# Patient Record
Sex: Female | Born: 1950
Health system: Southern US, Community
[De-identification: ages and names within clinical notes are randomized; demographics above are authoritative.]

## PROBLEM LIST (undated history)

## (undated) DIAGNOSIS — I10 Essential (primary) hypertension: Secondary | ICD-10-CM

## (undated) DIAGNOSIS — R3129 Other microscopic hematuria: Secondary | ICD-10-CM

## (undated) HISTORY — PX: KNEE ARTHROSCOPY: SUR90

## (undated) HISTORY — DX: Other microscopic hematuria: R31.29

---

## 2002-12-12 LAB — HM COLONOSCOPY

## 2007-02-13 LAB — HM PAP SMEAR

## 2007-02-13 LAB — CONVERTED CEMR LAB

## 2007-07-26 ENCOUNTER — Ambulatory Visit: Payer: Self-pay | Admitting: Internal Medicine

## 2007-07-26 DIAGNOSIS — R3129 Other microscopic hematuria: Secondary | ICD-10-CM | POA: Insufficient documentation

## 2007-07-26 LAB — CONVERTED CEMR LAB
Bilirubin Urine: NEGATIVE
Glucose, Urine, Semiquant: NEGATIVE
Nitrite: NEGATIVE
Protein, U semiquant: NEGATIVE
Specific Gravity, Urine: 1.025
Urobilinogen, UA: 0.2
WBC Urine, dipstick: NEGATIVE
pH: 5

## 2007-07-27 ENCOUNTER — Encounter: Payer: Self-pay | Admitting: Internal Medicine

## 2007-07-28 LAB — CONVERTED CEMR LAB
BUN: 8 mg/dL (ref 6–23)
Basophils Absolute: 0 10*3/uL (ref 0.0–0.1)
Basophils Relative: 0.3 % (ref 0.0–3.0)
CO2: 28 meq/L (ref 19–32)
Calcium: 9.5 mg/dL (ref 8.4–10.5)
Chloride: 108 meq/L (ref 96–112)
Creatinine, Ser: 0.8 mg/dL (ref 0.4–1.2)
Eosinophils Absolute: 0.1 10*3/uL (ref 0.0–0.7)
Eosinophils Relative: 1.3 % (ref 0.0–5.0)
GFR calc Af Amer: 96 mL/min
GFR calc non Af Amer: 79 mL/min
Glucose, Bld: 115 mg/dL — ABNORMAL HIGH (ref 70–99)
HCT: 41.4 % (ref 36.0–46.0)
Hemoglobin: 14.4 g/dL (ref 12.0–15.0)
Lymphocytes Relative: 32.9 % (ref 12.0–46.0)
MCHC: 34.7 g/dL (ref 30.0–36.0)
MCV: 89.8 fL (ref 78.0–100.0)
Monocytes Absolute: 0.7 10*3/uL (ref 0.1–1.0)
Monocytes Relative: 10.9 % (ref 3.0–12.0)
Neutro Abs: 3.2 10*3/uL (ref 1.4–7.7)
Neutrophils Relative %: 54.6 % (ref 43.0–77.0)
Platelets: 306 10*3/uL (ref 150–400)
Potassium: 3.8 meq/L (ref 3.5–5.1)
RBC: 4.61 M/uL (ref 3.87–5.11)
RDW: 12.9 % (ref 11.5–14.6)
Sodium: 140 meq/L (ref 135–145)
WBC: 6 10*3/uL (ref 4.5–10.5)

## 2007-08-10 ENCOUNTER — Ambulatory Visit: Payer: Self-pay | Admitting: Internal Medicine

## 2007-08-18 ENCOUNTER — Ambulatory Visit: Payer: Self-pay | Admitting: Internal Medicine

## 2007-08-18 LAB — CONVERTED CEMR LAB: Crystals, Fluid: NONE SEEN

## 2007-08-19 ENCOUNTER — Encounter: Payer: Self-pay | Admitting: Internal Medicine

## 2007-12-01 ENCOUNTER — Telehealth: Payer: Self-pay | Admitting: Internal Medicine

## 2009-09-14 ENCOUNTER — Emergency Department (HOSPITAL_BASED_OUTPATIENT_CLINIC_OR_DEPARTMENT_OTHER): Admission: EM | Admit: 2009-09-14 | Discharge: 2009-09-14 | Payer: Self-pay | Admitting: Emergency Medicine

## 2009-10-21 ENCOUNTER — Ambulatory Visit: Payer: Self-pay | Admitting: Internal Medicine

## 2009-10-21 LAB — CONVERTED CEMR LAB
Bilirubin Urine: NEGATIVE
Glucose, Urine, Semiquant: NEGATIVE
Ketones, urine, test strip: NEGATIVE
Nitrite: NEGATIVE
Protein, U semiquant: NEGATIVE
Specific Gravity, Urine: 1.02
Urobilinogen, UA: 0.2
WBC Urine, dipstick: NEGATIVE
pH: 7

## 2009-10-22 ENCOUNTER — Encounter: Payer: Self-pay | Admitting: Internal Medicine

## 2009-10-28 ENCOUNTER — Ambulatory Visit: Payer: Self-pay | Admitting: Internal Medicine

## 2009-10-28 LAB — CONVERTED CEMR LAB
ALT: 31 units/L (ref 0–35)
Alkaline Phosphatase: 129 units/L — ABNORMAL HIGH (ref 39–117)
Basophils Relative: 0.4 % (ref 0.0–3.0)
Bilirubin, Direct: 0.1 mg/dL (ref 0.0–0.3)
Calcium: 9.4 mg/dL (ref 8.4–10.5)
Chloride: 103 meq/L (ref 96–112)
Cholesterol: 225 mg/dL — ABNORMAL HIGH (ref 0–200)
Creatinine, Ser: 0.7 mg/dL (ref 0.4–1.2)
Eosinophils Relative: 1.7 % (ref 0.0–5.0)
Lymphocytes Relative: 33.3 % (ref 12.0–46.0)
MCV: 90.7 fL (ref 78.0–100.0)
Neutrophils Relative %: 52.7 % (ref 43.0–77.0)
RBC: 4.49 M/uL (ref 3.87–5.11)
Sodium: 138 meq/L (ref 135–145)
Total CHOL/HDL Ratio: 4
Total Protein: 6.9 g/dL (ref 6.0–8.3)
VLDL: 22.4 mg/dL (ref 0.0–40.0)
WBC: 6.1 10*3/uL (ref 4.5–10.5)

## 2009-10-30 ENCOUNTER — Ambulatory Visit: Payer: Self-pay | Admitting: Internal Medicine

## 2009-11-06 ENCOUNTER — Ambulatory Visit: Payer: Self-pay | Admitting: Internal Medicine

## 2009-11-06 DIAGNOSIS — M25469 Effusion, unspecified knee: Secondary | ICD-10-CM

## 2009-11-12 IMAGING — CR DG KNEE COMPLETE 4+V*R*
4 series · 4 of 4 positions shown · non-contrast
Comparison: None available.

CLINICAL DATA: Knee pain.

RIGHT KNEE - COMPLETE 4+ VIEW

[view not recorded (1 of 4)]
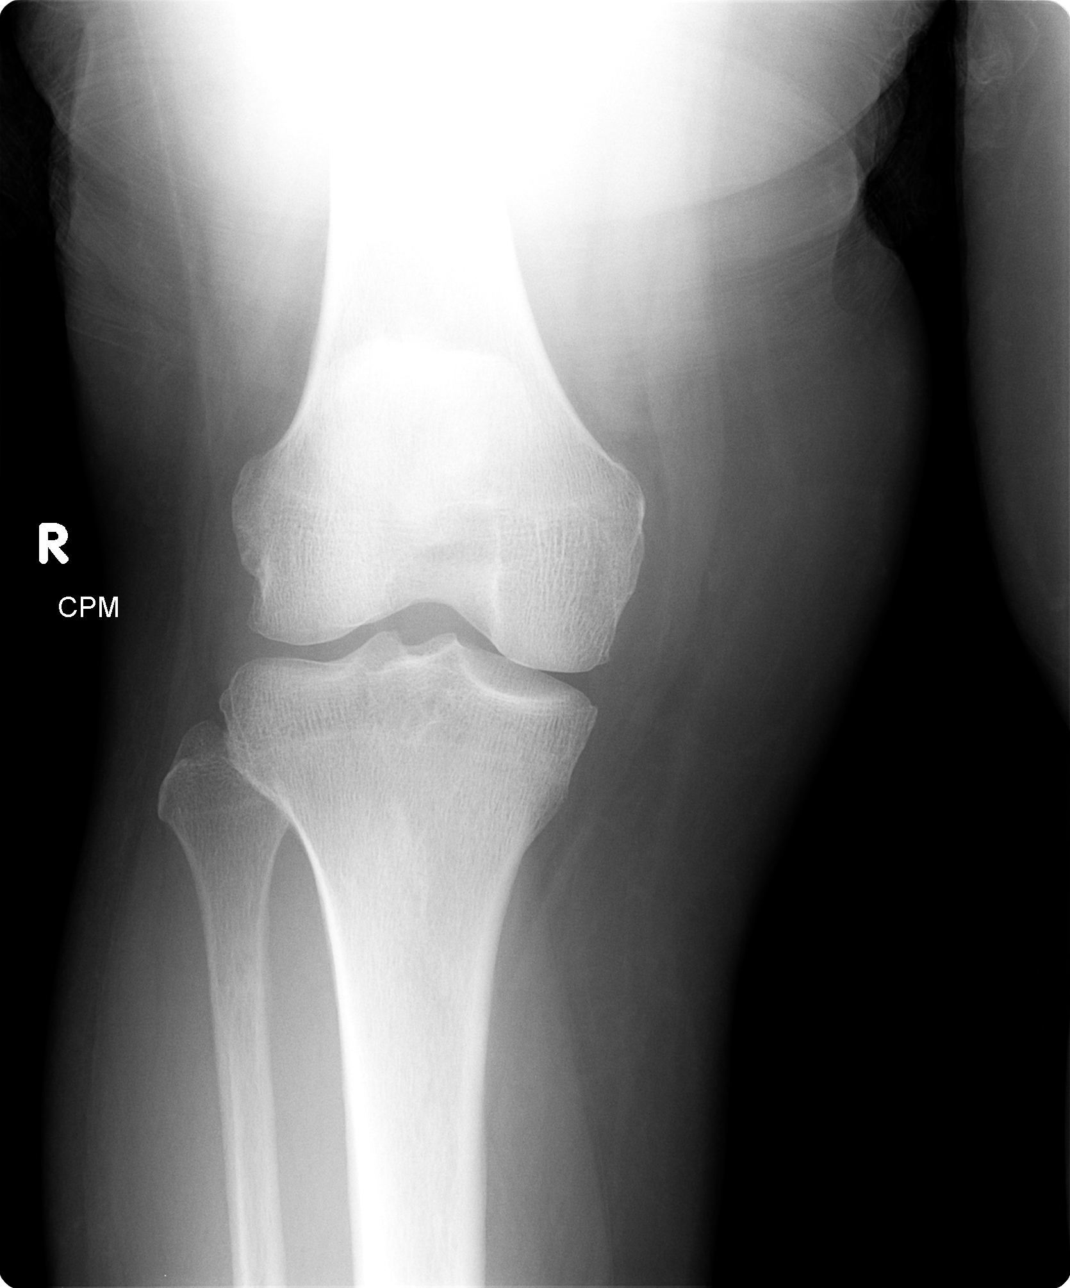

[view not recorded (2 of 4)]
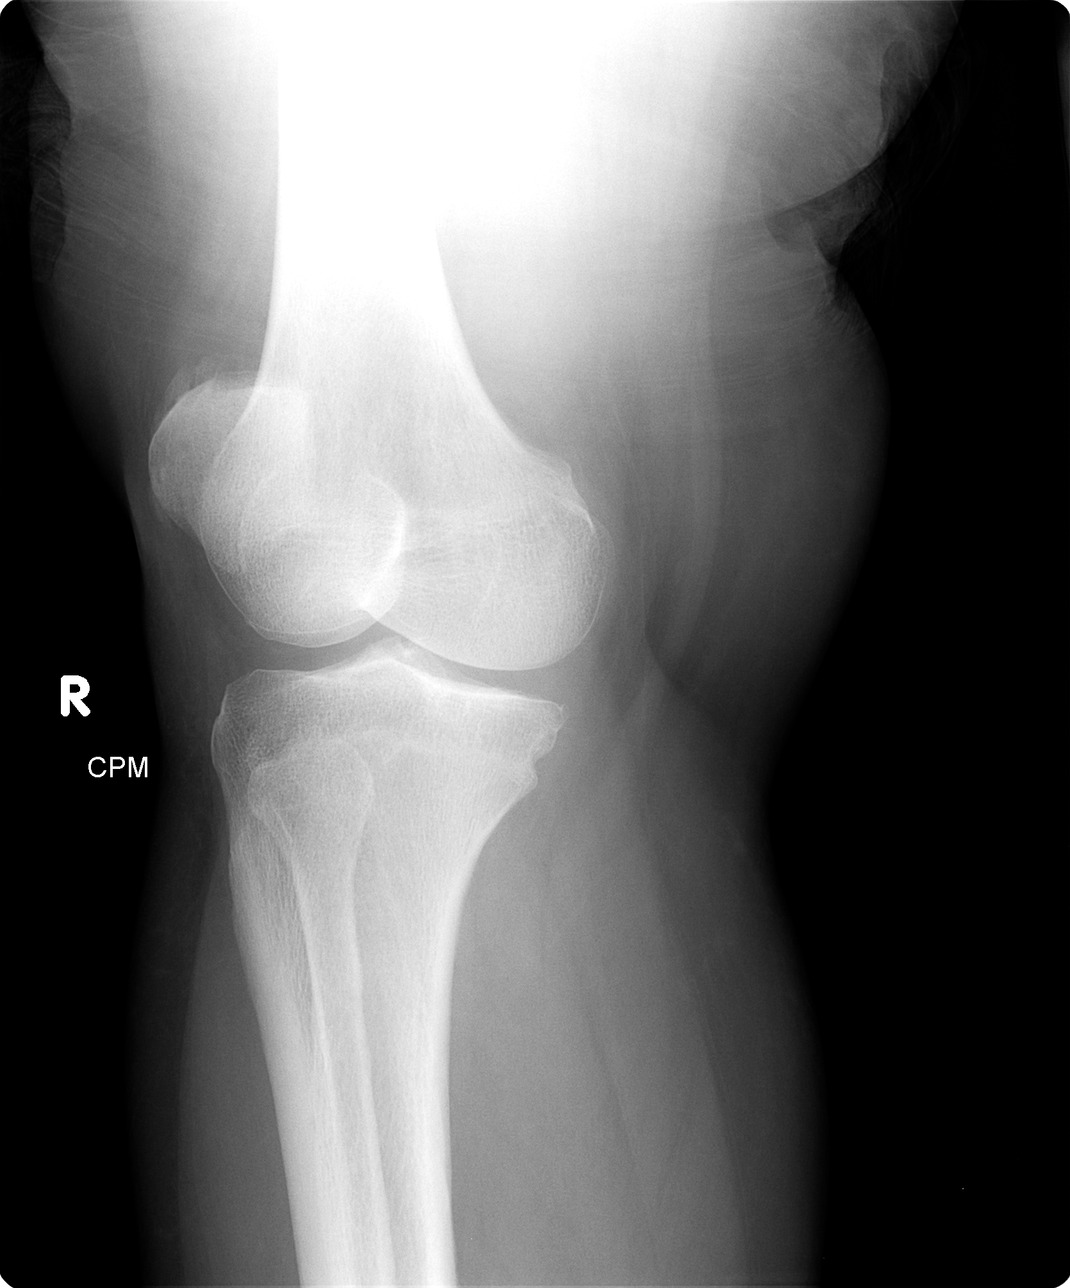

[view not recorded (3 of 4)]
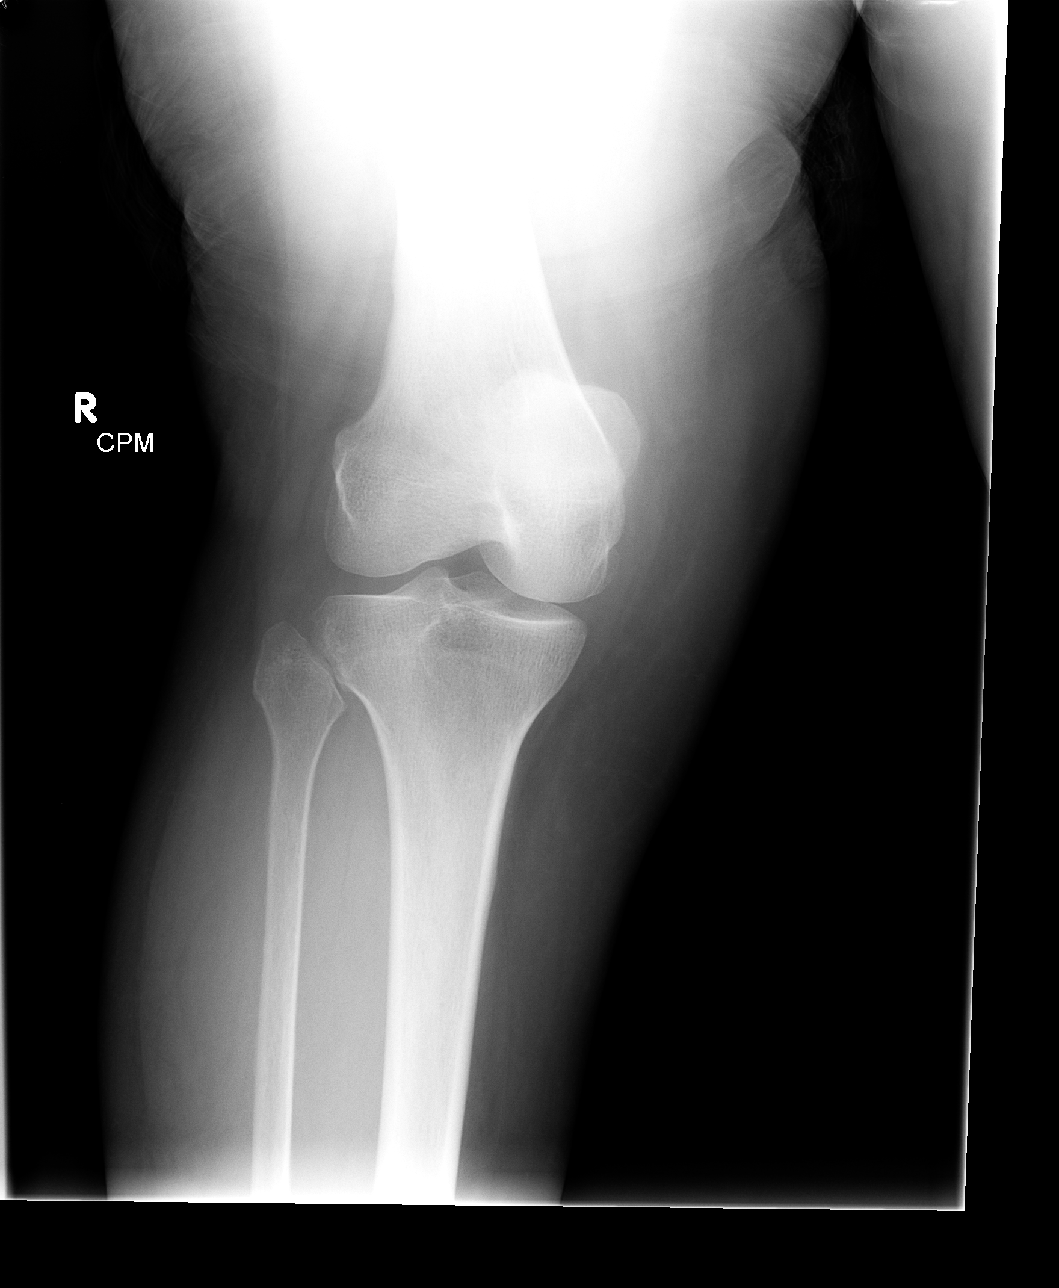

[view not recorded (4 of 4)]
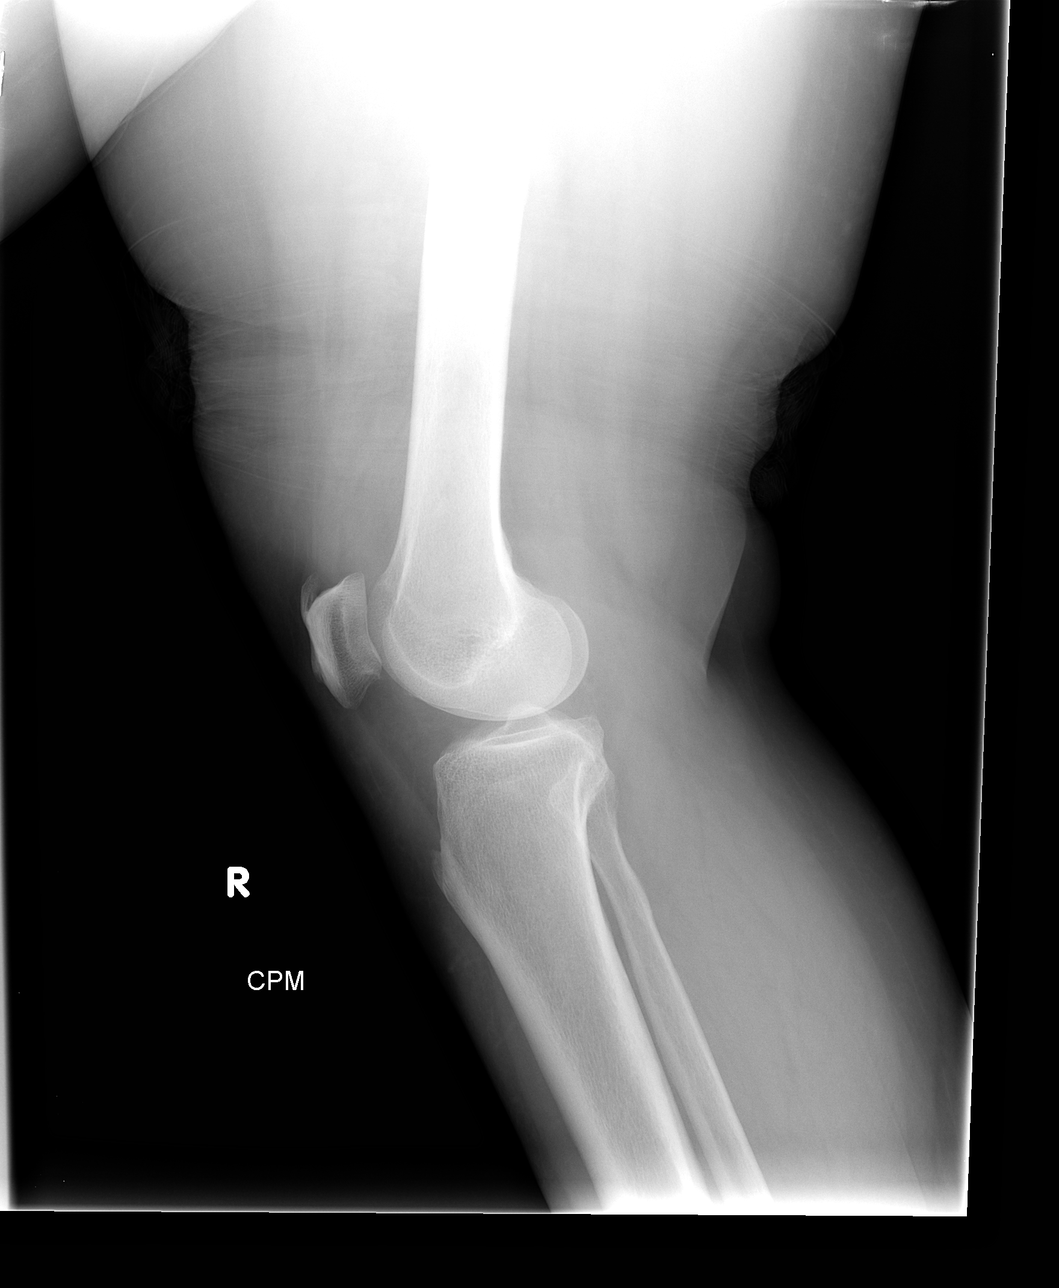

[4 of 4 positions shown; findings below may reference images not displayed]

FINDINGS: There is no acute bony or joint abnormality.
Enthesopathic change at the quadriceps tendon insertion is noted.
No marked degenerative disease.  No joint effusion.
IMPRESSION: No acute finding.

## 2009-12-23 ENCOUNTER — Encounter: Payer: Self-pay | Admitting: Gastroenterology

## 2010-01-23 ENCOUNTER — Encounter (INDEPENDENT_AMBULATORY_CARE_PROVIDER_SITE_OTHER): Payer: Self-pay | Admitting: *Deleted

## 2010-01-23 ENCOUNTER — Ambulatory Visit: Admit: 2010-01-23 | Payer: Self-pay | Admitting: Gastroenterology

## 2010-02-02 ENCOUNTER — Encounter: Payer: Self-pay | Admitting: Internal Medicine

## 2010-02-02 ENCOUNTER — Ambulatory Visit: Admit: 2010-02-02 | Payer: Self-pay | Admitting: Gastroenterology

## 2010-02-10 NOTE — Assessment & Plan Note (Signed)
Summary: TB READ/CB  Nurse Visit   Allergies: No Known Drug Allergies  PPD Results    Date of reading: 10/30/2009    Results: < 5mm    Interpretation: negative

## 2010-02-10 NOTE — Assessment & Plan Note (Signed)
Summary: CPX // RS----PT WILL FAST//CCM   Vital Signs:  Patient profile:   60 year old female Weight:      211 pounds BMI:     38.11 Temp:     98.2 degrees F oral Pulse rate:   72 / minute Pulse rhythm:   regular Resp:     16 per minute BP sitting:   132 / 82  Vitals Entered By: Lynann Beaver CMA (November 17, 2009 10:36 AM) CC: cpx Is Patient Diabetic? No Pain Assessment Patient in pain? no        CC:  cpx.  History of Present Illness: cpx she complains of knee pain---she has had steroid injection before with great results  Current Problems (verified): 1)  Microscopic Hematuria  (ICD-599.72)  Current Medications (verified): 1)  No Medications 2)  Nexium 40 Mg Pack (Esomeprazole Magnesium) .... Prn  Allergies (verified): No Known Drug Allergies  Past History:  Past Medical History: Last updated: 07/26/2007 hematuria, microscopic  Past Surgical History: Last updated: 07/26/2007 Denies surgical history  Family History: Last updated: November 17, 2009 father- deceased cancer-lung Family History Lung cancer-father mother deceased MI age 32 Family History Diabetes 1st degree relative both parents and all sibs Family History Hypertension parents and siblings Family History High cholesterol mother and siblings dtr--colon ca/? rectal ca. .   Social History: Last updated: 07/26/2007 Occupation: hair stylist Married--separated Never Smoked Alcohol use-no Regular exercise-no  Risk Factors: Exercise: no (07/26/2007)  Risk Factors: Smoking Status: never (07/26/2007)  Family History: father- deceased cancer-lung Family History Lung cancer-father mother deceased MI age 45 Family History Diabetes 1st degree relative both parents and all sibs Family History Hypertension parents and siblings Family History High cholesterol mother and siblings dtr--colon ca/? rectal ca. .   Physical Exam  General:  full range of motion of both knees. She does have an effusion  of the left knee with bulge sign. No erythema.   Impression & Recommendations:  Problem # 1:  PREVENTIVE HEALTH CARE (ICD-V70.0) health maint Orders: Venipuncture (46962) TLB-Lipid Panel (80061-LIPID) TLB-BMP (Basic Metabolic Panel-BMET) (80048-METABOL) TLB-CBC Platelet - w/Differential (85025-CBCD) TLB-Hepatic/Liver Function Pnl (80076-HEPATIC) TLB-TSH (Thyroid Stimulating Hormone) (84443-TSH) UA Dipstick w/o Micro (automated)  (81003) Specimen Handling (95284) Gastroenterology Referral (GI)  Problem # 2:  MICROSCOPIC HEMATURIA (ICD-599.72) repeat UA  Problem # 3:  KNEE PAIN, RIGHT (ICD-719.46) discussed. Reviewed previous x-ray. She requests injection. Risks and benefits discussed. She provides verbal consent.  Prepped and draped in a sterile fashion. Area was anesthetized with 1% lidocaine without epinephrine. The knee joint was entered with a 20-gauge needle subpatellar. 20 cc of cloudy fluid, yellow removed. Injection of 40 mg Depo-Medrol with half cc of 1% lidocaine. Orders: T-Crystals, Synovial Fluid [id number]) T-Culture & Smear Routine Fluid (Body Fluid) (87070/[id number]) Joint Aspirate / Injection, Large (20610) Depo- Medrol 40mg  (J1030) Specimen Handling (25366)  Complete Medication List: 1)  No Medications  2)  Nexium 40 Mg Pack (Esomeprazole magnesium) .... Prn  Other Orders: TB Skin Test [id number]) Admin 1st Vaccine (74259)   Immunizations Administered:  PPD Skin Test:    Vaccine Type: PPD    Site: right forearm    Dose: 0.1 ml    Route: ID    Given by: Lynann Beaver CMA    Exp. Date: 11/09/2010    Lot #: [id number] Physical Exam General Appearance: well developed, well nourished, no acute distress Eyes: conjunctiva and lids normal, PERRL, EOMI,  Ears, Nose, Mouth, Throat: TM clear, nares clear, oral exam WNL Neck: supple,  no lymphadenopathy, no thyromegaly, no JVD Respiratory: clear to auscultation and percussion, respiratory effort  normal Cardiovascular: regular rate and rhythm, S1-S2, no murmur, rub or gallop, no bruits, peripheral pulses normal and symmetric, no cyanosis, clubbing, edema or varicosities Chest: no scars, masses, tenderness; no asymmetry, skin changes, nipple discharge   Gastrointestinal: soft, non-tender; no hepatosplenomegaly, masses; active bowel sounds all quadrants,  Lymphatic: no cervical, axillary or inguinal adenopathy Musculoskeletal: gait normal, muscle tone and strength WNL, no joint swelling, effusions, discoloration, crepitus  Skin: clear, good turgor, color WNL, no rashes, lesions, or ulcerations Neurologic: normal mental status, normal reflexes, normal strength, sensation, and motion Psychiatric: alert; oriented to person, place and time Other Exam:     Laboratory Results   Urine Tests    Routine Urinalysis   Color: yellow Appearance: Clear Glucose: negative   (Normal Range: Negative) Bilirubin: negative   (Normal Range: Negative) Ketone: negative   (Normal Range: Negative) Spec. Gravity: 1.020   (Normal Range: 1.003-1.035) Blood: 1+   (Normal Range: Negative) pH: 7.0   (Normal Range: 5.0-8.0) Protein: negative   (Normal Range: Negative) Urobilinogen: 0.2   (Normal Range: 0-1) Nitrite: negative   (Normal Range: Negative) Leukocyte Esterace: negative   (Normal Range: Negative)    Comments: Rita Ohara  October 21, 2009 11:51 AM

## 2010-02-10 NOTE — Letter (Signed)
Summary: [facility]  [facility]   Imported By: Maryln Gottron 11/25/2009 09:43:01  _____________________________________________________________________  External Attachment:    Type:   Image     Comment:   External Document

## 2010-02-10 NOTE — Assessment & Plan Note (Signed)
Summary: tb test//slm  Nurse Visit   Allergies: No Known Drug Allergies  Immunizations Administered:  PPD Skin Test:    Vaccine Type: PPD    Site: left forearm    Mfr: Sanofi Pasteur    Dose: 0.1 ml    Route: ID    Given by: Lynann Beaver CMA    Exp. Date: 11/09/2010    Lot #: [id number]

## 2010-02-10 NOTE — Letter (Signed)
Summary: TB Skin Test  All     ,     Phone:   Fax:           TB Skin Test    Kristina Love    Date TB Test Placed:  10/28/2009 on left forearm____________  L or R forearm  TB Test Placed by:  Lynann Beaver CMA_____________  Lot #:  [id number]______        Expiration Date  October 29, 2012______  Date TB Test Read:  _10/20/2011___________________    Result ___Negative________MM  TB Test Read by:  Lynann Beaver, CMA AAMA___________

## 2010-02-10 NOTE — Assessment & Plan Note (Signed)
Summary: SEVERE KNEE PAIN/PT CAN HARDLY WALK/CJR   History of Present Illness: 60 year old patient, who presents with a one-week history of worsening pain and swelling involving the left knee.  There has been no trauma.  She has had arthrocentesis performed twice, with no inflammatory cells, and no evidence of gout or infection.  She has responded well to intra-articular cortisone, but just briefly.  An x-ray of the knee revealed no significant arthritic changes.  She requests orthopedic referral.  Allergies: No Known Drug Allergies  Past History:  Past Medical History: Reviewed history from 07/26/2007 and no changes required. hematuria, microscopic  Family History: Reviewed history from 10/21/2009 and no changes required. father- deceased cancer-lung Family History Lung cancer-father mother deceased MI age 44 Family History Diabetes 1st degree relative both parents and all sibs Family History Hypertension parents and siblings Family History High cholesterol mother and siblings dtr--colon ca/? rectal ca. .   Social History: Reviewed history from 07/26/2007 and no changes required. Occupation: hair stylist Married--separated Never Smoked Alcohol use-no Regular exercise-no  Review of Systems       The patient complains of difficulty walking.  The patient denies anorexia, fever, weight loss, weight gain, vision loss, decreased hearing, hoarseness, chest pain, syncope, dyspnea on exertion, peripheral edema, prolonged cough, headaches, hemoptysis, abdominal pain, melena, hematochezia, severe indigestion/heartburn, hematuria, incontinence, genital sores, muscle weakness, suspicious skin lesions, transient blindness, depression, unusual weight change, abnormal bleeding, enlarged lymph nodes, angioedema, and breast masses.    Physical Exam  General:  overweight-appearing.   Msk:  left knee has a moderate size effusion with excess of warmth and tenderness   Impression &  Recommendations:  Problem # 1:  JOINT EFFUSION, LEFT KNEE (ICD-719.06)  will schedule  for prompt orthopedic evaluation due to the chronicity;  I suspect the patient may have a degenerated meniscal tear  Orders: Orthopedic Referral (Ortho)  Complete Medication List: 1)  No Medications  2)  Nexium 40 Mg Pack (Esomeprazole magnesium) .... Prn  Patient Instructions: 1)  orthopedic follow-up today as scheduled   Orders Added: 1)  Est. Patient Level III [16109] 2)  Orthopedic Referral [Ortho]

## 2010-02-12 NOTE — Letter (Signed)
Summary: Pre Visit Letter Revised  [facility]  [address] [place], [place] [address]   Phone: [phone]  Fax: [phone]        12/23/2009 MRN: [id number]  Kristina Love [address] [place], [place]  [address]             Procedure Date:  02-02-10 11am           Dr Arlyce Dice   Welcome to the Gastroenterology Division at [facility].    You are scheduled to see a nurse for your pre-procedure visit on 01-23-09 at 11am on the 3rd floor at [facility], [address]  We ask that you try to arrive at our office 15 minutes prior to your appointment time to allow for check-in.  Please take a minute to review the attached form.  If you answer "Yes" to one or more of the questions on the first page, we ask that you call the person listed at your earliest opportunity.  If you answer "No" to all of the questions, please complete the rest of the form and bring it to your appointment.    Your nurse visit will consist of discussing your medical and surgical history, your immediate family medical history, and your medications.   If you are unable to list all of your medications on the form, please bring the medication bottles to your appointment and we will list them.  We will need to be aware of both prescribed and over the counter drugs.  We will need to know exact dosage information as well.    Please be prepared to read and sign documents such as consent forms, a financial agreement, and acknowledgement forms.  If necessary, and with your consent, a friend or relative is welcome to sit-in on the nurse visit with you.  Please bring your insurance card so that we may make a copy of it.  If your insurance requires a referral to see a specialist, please bring your referral form from your primary care physician.  No co-pay is required for this nurse visit.     If you cannot keep your appointment, please call [phone] to cancel or reschedule prior to your  appointment date.  This allows [place] the opportunity to schedule an appointment for another patient in need of care.    Thank you for choosing [facility] for your medical needs.  We appreciate the opportunity to care for you.  Please visit [place] at our website  to learn more about our practice.  Sincerely, The Gastroenterology Division

## 2010-02-12 NOTE — Letter (Signed)
Summary: Pre Visit No Show Letter  [facility]  [address] [place], [place] [address]   Phone: [phone]  Fax: [phone]        January 23, 2010 MRN: [id number]    [facility] [address] [place], [place]  [address]    Dear Kristina Love,   We have been unable to reach you by phone concerning the pre-procedure visit that you missed on Friday 01-23-10 . For this reason,your procedure scheduled on 02-02-10 has been cancelled. Our scheduling staff will gladly assist you with rescheduling your appointments at a more convenient time. Please call our office at [phone] between the hours of 8:00am and 5:00pm, press option #2 to reach an appointment scheduler. Please consider updating your contact numbers at this time so that we can reach you by phone in the future with schedule changes or results.    Thank you,    Sherren Kerns RN [facility]

## 2010-02-18 NOTE — Consult Note (Signed)
Summary: [facility]  [facility]   Imported By: Maryln Gottron 02/11/2010 09:10:53  _____________________________________________________________________  External Attachment:    Type:   Image     Comment:   External Document

## 2010-05-12 ENCOUNTER — Encounter: Payer: Self-pay | Admitting: Internal Medicine

## 2010-10-12 ENCOUNTER — Encounter (HOSPITAL_BASED_OUTPATIENT_CLINIC_OR_DEPARTMENT_OTHER): Payer: Self-pay | Admitting: Family Medicine

## 2010-10-12 ENCOUNTER — Emergency Department (HOSPITAL_BASED_OUTPATIENT_CLINIC_OR_DEPARTMENT_OTHER)
Admission: EM | Admit: 2010-10-12 | Discharge: 2010-10-12 | Disposition: A | Discharge status: Home / Self Care | Payer: No Typology Code available for payment source | Attending: Emergency Medicine | Admitting: Emergency Medicine

## 2010-10-12 DIAGNOSIS — Y92009 Unspecified place in unspecified non-institutional (private) residence as the place of occurrence of the external cause: Secondary | ICD-10-CM | POA: Insufficient documentation

## 2010-10-12 DIAGNOSIS — X19XXXA Contact with other heat and hot substances, initial encounter: Secondary | ICD-10-CM | POA: Insufficient documentation

## 2010-10-12 DIAGNOSIS — T300 Burn of unspecified body region, unspecified degree: Secondary | ICD-10-CM

## 2010-10-12 DIAGNOSIS — T23219A Burn of second degree of unspecified thumb (nail), initial encounter: Secondary | ICD-10-CM | POA: Insufficient documentation

## 2010-10-12 MED ORDER — SILVER SULFADIAZINE 1 % EX CREA
TOPICAL_CREAM | Freq: Once | CUTANEOUS | Status: AC
  Administered 2010-10-12: 19:00:00 via TOPICAL
  Filled 2010-10-12: qty 85

## 2010-10-12 MED ORDER — SILVER SULFADIAZINE 1 % EX CREA: TOPICAL_CREAM | Freq: Two times a day (BID) | CUTANEOUS | Status: AC
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
## 2016-07-18 ENCOUNTER — Encounter (HOSPITAL_BASED_OUTPATIENT_CLINIC_OR_DEPARTMENT_OTHER): Payer: Self-pay | Admitting: Emergency Medicine

## 2016-07-18 ENCOUNTER — Emergency Department (HOSPITAL_BASED_OUTPATIENT_CLINIC_OR_DEPARTMENT_OTHER)
Admission: EM | Admit: 2016-07-18 | Discharge: 2016-07-18 | Disposition: A | Discharge status: Home / Self Care | Payer: Medicare PPO | Attending: Emergency Medicine | Admitting: Emergency Medicine

## 2016-07-18 DIAGNOSIS — Z79899 Other long term (current) drug therapy: Secondary | ICD-10-CM | POA: Insufficient documentation

## 2016-07-18 DIAGNOSIS — Z794 Long term (current) use of insulin: Secondary | ICD-10-CM | POA: Insufficient documentation

## 2016-07-18 DIAGNOSIS — R600 Localized edema: Secondary | ICD-10-CM | POA: Insufficient documentation

## 2016-07-18 DIAGNOSIS — Z91038 Other insect allergy status: Secondary | ICD-10-CM | POA: Insufficient documentation

## 2016-07-18 MED ORDER — DIPHENHYDRAMINE HCL 50 MG/ML IJ SOLN
25.0000 mg | Freq: Once | INTRAMUSCULAR | Status: AC
  Administered 2016-07-18: 25 mg via INTRAVENOUS
  Filled 2016-07-18: qty 1

## 2016-07-18 MED ORDER — FAMOTIDINE IN NACL 20-0.9 MG/50ML-% IV SOLN
20.0000 mg | Freq: Once | INTRAVENOUS | Status: AC
  Administered 2016-07-18: 20 mg via INTRAVENOUS
  Filled 2016-07-18: qty 50

## 2016-07-18 MED ORDER — IPRATROPIUM-ALBUTEROL 0.5-2.5 (3) MG/3ML IN SOLN
3.0000 mL | RESPIRATORY_TRACT | Status: DC
  Administered 2016-07-18: 3 mL via RESPIRATORY_TRACT
  Filled 2016-07-18: qty 3

## 2016-07-18 MED ORDER — DEXAMETHASONE SODIUM PHOSPHATE 10 MG/ML IJ SOLN
10.0000 mg | Freq: Once | INTRAMUSCULAR | Status: AC
  Administered 2016-07-18: 10 mg via INTRAVENOUS
  Filled 2016-07-18: qty 1

## 2016-07-18 MED ORDER — DIPHENHYDRAMINE HCL 25 MG PO CAPS: ORAL_CAPSULE | ORAL | Status: AC

## 2016-07-18 NOTE — ED Triage Notes (Addendum)
Pt woke up with difficulty swallowing and edema to L side of lips ~0200. States she thinks ants bit her. Redness and itching to R medial upper forearm. Pt able to walk to tx room, and speak in complete sentences. Took Aleve PM ~0130.

## 2016-07-18 NOTE — ED Provider Notes (Signed)
[facility] Provider Note: Lowella Dell, MD, FACEP  CSN: [id number] MRN: [id number] ARRIVAL: 07/18/16 at 0228 ROOM: MH12/MH12   CHIEF COMPLAINT  Allergic Reaction   HISTORY OF PRESENT ILLNESS  Kristina Love is a 66 y.o. female with an allergy to an. She states she was bitten by several black and about 1 AM this morning. She has subsequently developed swelling of the lips and throat with associated shortness of breath and itching of her upper arms at the site of the bites. She took one Aleve p.m. earlier for sleep. She denies wheezing, nausea, vomiting, diarrhea or abdominal pain.   Past Medical History:  Diagnosis Date  . Hematuria, microscopic     Past Surgical History:  Procedure Laterality Date  . KNEE ARTHROSCOPY      Family History  Problem Relation Age of Onset  . Heart attack Mother   . Diabetes Mother   . Hypertension Mother   . Hyperlipidemia Mother   . Cancer Father        lung  . Diabetes Father   . Hypertension Father   . Cancer Daughter        colon    Social History  Substance Use Topics  . Smoking status: Never Smoker  . Smokeless tobacco: Never Used  . Alcohol use No    Prior to Admission medications   Medication Sig Start Date End Date Taking? Authorizing Provider  esomeprazole (NEXIUM) 40 MG capsule Take 40 mg by mouth as needed. For acid reflux   Yes [provider]  hydrochlorothiazide (HYDRODIURIL) 12.5 MG tablet Take 12.5 mg by mouth daily.   Yes [provider]  lisinopril (PRINIVIL,ZESTRIL) 40 MG tablet Take 40 mg by mouth as directed. Take 1/2 tablet daily    Yes [provider]  insulin lispro (HUMALOG) 100 UNIT/ML injection Inject into the skin 3 (three) times daily before meals.      [provider]  Melatonin 5 MG TABS Take 1 tablet by mouth at bedtime as needed. For sleep     [provider]  naproxen sodium (ANAPROX) 220 MG tablet Take 440 mg by mouth 2 (two) times  daily with a meal.      [provider]    Allergies Patient has no known allergies.   REVIEW OF SYSTEMS  Negative except as noted here or in the History of Present Illness.   PHYSICAL EXAMINATION  Initial Vital Signs Blood pressure (!) 213/102, pulse 74, temperature 98.4 F (36.9 C), temperature source Oral, resp. rate 18, height 5\' 3"  (1.6 m), weight 83.9 kg (185 lb), SpO2 100 %.  Examination General: Well-developed, well-nourished female in no acute distress; appearance consistent with age of record HENT: normocephalic; atraumatic; edema of the lips, soft palate and uvula; no dysphonia; no stridor Eyes: pupils equal, round and reactive to light; extraocular muscles intact Neck: supple Heart: regular rate and rhythm Lungs: decreased air movement bilaterally Abdomen: soft; nondistended; nontender; bowel sounds present Extremities: No deformity; full range of motion; pulses normal Neurologic: Awake, alert and oriented; motor function intact in all extremities and symmetric; no facial droop Skin: Warm and dry; evidence of scratching of upper arms at reported sites of at bites Psychiatric: Flat affect   RESULTS  Summary of this visit's results, reviewed by myself:   EKG Interpretation  Date/Time:    Ventricular Rate:    PR Interval:    QRS Duration:   QT Interval:    QTC Calculation:   R  Axis:     Text Interpretation:        Laboratory Studies: No results found for this or any previous visit (from the past 24 hour(s)). Imaging Studies: No results found.  ED COURSE  Nursing notes and initial vitals signs, including pulse oximetry, reviewed.  Vitals:   07/18/16 0232 07/18/16 0242 07/18/16 0243 07/18/16 0338  BP: (!) 213/102     Pulse: 74     Resp: 18     Temp: 98.4 F (36.9 C)     TempSrc: Oral     SpO2: 100% 100% 100% 98%  Weight: 83.9 kg (185 lb)     Height: 5\' 3"  (1.6 m)      4:48 AM Breathing improved after DuoNeb treatment. Swelling  improved after IV medications. Patient insists that she has to leave this time. We will have her take Benadryl for the next several days and advised her to return if symptoms worsen.  PROCEDURES    ED DIAGNOSES     ICD-10-CM   1. Allergy to ant bite Z91.038        Eber Ferrufino, Jonny RuizJohn, MD 07/18/16 [phone]

## 2017-09-22 ENCOUNTER — Encounter (HOSPITAL_BASED_OUTPATIENT_CLINIC_OR_DEPARTMENT_OTHER): Payer: Self-pay

## 2017-09-22 ENCOUNTER — Other Ambulatory Visit: Payer: Self-pay

## 2017-09-22 ENCOUNTER — Emergency Department (HOSPITAL_BASED_OUTPATIENT_CLINIC_OR_DEPARTMENT_OTHER)
Admission: EM | Admit: 2017-09-22 | Discharge: 2017-09-22 | Disposition: A | Discharge status: Home / Self Care | Payer: Medicare PPO | Attending: Emergency Medicine | Admitting: Emergency Medicine

## 2017-09-22 DIAGNOSIS — Z79899 Other long term (current) drug therapy: Secondary | ICD-10-CM | POA: Diagnosis not present

## 2017-09-22 DIAGNOSIS — L509 Urticaria, unspecified: Secondary | ICD-10-CM | POA: Insufficient documentation

## 2017-09-22 DIAGNOSIS — I10 Essential (primary) hypertension: Secondary | ICD-10-CM | POA: Diagnosis not present

## 2017-09-22 HISTORY — DX: Essential (primary) hypertension: I10

## 2017-09-22 MED ORDER — FAMOTIDINE 20 MG PO TABS
20.0000 mg | ORAL_TABLET | Freq: Once | ORAL | Status: AC
  Administered 2017-09-22: 20 mg via ORAL
  Filled 2017-09-22: qty 1

## 2017-09-22 MED ORDER — PREDNISONE 50 MG PO TABS
60.0000 mg | ORAL_TABLET | Freq: Once | ORAL | Status: AC
  Administered 2017-09-22: 60 mg via ORAL
  Filled 2017-09-22: qty 1

## 2017-09-22 MED ORDER — FAMOTIDINE 20 MG PO TABS: 20.0000 mg | ORAL_TABLET | Freq: Two times a day (BID) | ORAL | 0 refills | Status: DC

## 2017-09-22 MED ORDER — PREDNISONE 20 MG PO TABS: ORAL_TABLET | ORAL | 0 refills | Status: DC

## 2017-09-22 NOTE — ED Triage Notes (Signed)
C/o scattered hives-started last night-NAD-steady gait

## 2017-09-22 NOTE — ED Notes (Signed)
Pt is c/o rash that started last night after she ate some shrimp  Pt states it had improved this morning but tonight she ate the left over shrimp and broke out again  Pt has red rash noted  Pt states it itches

## 2017-09-22 NOTE — ED Provider Notes (Signed)
[facility] EMERGENCY DEPARTMENT Provider Note   CSN: [id number] Arrival date & time: 09/22/17  2250     History   Chief Complaint Chief Complaint  Patient presents with  . Urticaria    HPI Kristina Love is a 67 y.o. female.  The history is provided by the patient.  Urticaria  This is a new problem. The current episode started 12 to 24 hours ago. The problem occurs constantly. The problem has not changed since onset.Pertinent negatives include no chest pain, no abdominal pain, no headaches and no shortness of breath. Nothing aggravates the symptoms. Nothing relieves the symptoms. She has tried nothing for the symptoms. The treatment provided no relief.   No swelling of the lips tongue or uvula.  No SOB, no wheezing.  No new meds. No detergents no perfumes or food exposure that patient can recall.    Past Medical History:  Diagnosis Date  . Hematuria, microscopic   . Hypertension     Patient Active Problem List   Diagnosis Date Noted  . JOINT EFFUSION, LEFT KNEE 11/06/2009  . MICROSCOPIC HEMATURIA 07/26/2007    Past Surgical History:  Procedure Laterality Date  . KNEE ARTHROSCOPY       OB History   None      Home Medications    Prior to Admission medications   Medication Sig Start Date End Date Taking? Authorizing Provider  diphenhydrAMINE (BENADRYL) 25 mg capsule Take 50 milligrams (two capsules) every 6 hours as needed for allergy symptoms. 07/18/16   Molpus, John, MD  esomeprazole (NEXIUM) 40 MG capsule Take 40 mg by mouth as needed. For acid reflux    [provider]  hydrochlorothiazide (HYDRODIURIL) 12.5 MG tablet Take 12.5 mg by mouth daily.    [provider]  insulin lispro (HUMALOG) 100 UNIT/ML injection Inject into the skin 3 (three) times daily before meals.      [provider]  lisinopril (PRINIVIL,ZESTRIL) 40 MG tablet Take 40 mg by mouth as directed. Take 1/2 tablet daily     [provider]  Melatonin 5  MG TABS Take 1 tablet by mouth at bedtime as needed. For sleep     [provider]  naproxen sodium (ANAPROX) 220 MG tablet Take 440 mg by mouth 2 (two) times daily with a meal.      [provider]    Family History Family History  Problem Relation Age of Onset  . Heart attack Mother   . Diabetes Mother   . Hypertension Mother   . Hyperlipidemia Mother   . Cancer Father        lung  . Diabetes Father   . Hypertension Father   . Cancer Daughter        colon    Social History Social History   Tobacco Use  . Smoking status: Never Smoker  . Smokeless tobacco: Never Used  Substance Use Topics  . Alcohol use: No  . Drug use: No     Allergies   Patient has no known allergies.   Review of Systems Review of Systems  Constitutional: Negative for fever.  HENT: Negative for facial swelling and voice change.   Respiratory: Negative for cough, choking, chest tightness, shortness of breath, wheezing and stridor.   Cardiovascular: Negative for chest pain.  Gastrointestinal: Negative for abdominal pain.  Skin: Positive for rash.  Neurological: Negative for headaches.  All other systems reviewed and are negative.    Physical Exam Updated Vital Signs BP Marland Kitchen(!)  182/89 (BP Location: Left Arm)   Pulse 64   Temp 98.2 F (36.8 C) (Oral)   Resp 18   Ht 5\' 3"  (1.6 m)   Wt 93.4 kg   SpO2 99%   BMI 36.49 kg/m   Physical Exam  Constitutional: She is oriented to person, place, and time. She appears well-developed and well-nourished. No distress.  HENT:  Head: Normocephalic and atraumatic.  Nose: Nose normal.  Mouth/Throat: No oropharyngeal exudate.  No swelling of the lips tongue or uvula.    Eyes: Pupils are equal, round, and reactive to light. Conjunctivae are normal.  Neck: Normal range of motion. Neck supple.  Cardiovascular: Normal rate, regular rhythm, normal heart sounds and intact distal pulses.  Pulmonary/Chest: Effort normal and breath sounds normal.  No stridor. She has no wheezes.  Abdominal: Soft. Bowel sounds are normal. She exhibits no mass. There is no tenderness. There is no guarding.  Musculoskeletal: Normal range of motion.  Neurological: She is alert and oriented to person, place, and time. She displays normal reflexes.  Skin: Capillary refill takes less than 2 seconds.  A few scattered wheals on the torso  Psychiatric: She has a normal mood and affect.     ED Treatments / Results  Labs (all labs ordered are listed, but only abnormal results are displayed) Labs Reviewed - No data to display  EKG None  Radiology No results found.  Procedures Procedures (including critical care time)  Medications Ordered in ED Medications  predniSONE (DELTASONE) tablet 60 mg (60 mg Oral Given 09/22/17 2316)  famotidine (PEPCID) tablet 20 mg (20 mg Oral Given 09/22/17 2316)      Final Clinical Impressions(s) / ED Diagnoses    Return for fevers >100.4 unrelieved by medication, shortness of breath, intractable vomiting, or diarrhea, Inability to tolerate liquids or food, cough, altered mental status or any concerns. No signs of systemic illness or infection. The patient is nontoxic-appearing on exam and vital signs are within normal limits.   I have reviewed the triage vital signs and the nursing notes. Pertinent labs &imaging results that were available during my care of the patient were reviewed by me and considered in my medical decision making (see chart for details).  After history, exam, and medical workup I feel the patient has been appropriately medically screened and is safe for discharge home. Pertinent diagnoses were discussed with the patient. Patient was given return precautions.      Timouthy Gilardi, MD 09/22/17 2320

## 2017-10-31 ENCOUNTER — Encounter: Payer: Self-pay | Admitting: Pediatrics

## 2017-10-31 ENCOUNTER — Ambulatory Visit (INDEPENDENT_AMBULATORY_CARE_PROVIDER_SITE_OTHER): Payer: Medicare PPO | Admitting: Pediatrics

## 2017-10-31 VITALS — BP 166/98 | HR 52 | Temp 98.4°F | Resp 20 | Ht 62.0 in | Wt 207.2 lb

## 2017-10-31 DIAGNOSIS — T7800XD Anaphylactic reaction due to unspecified food, subsequent encounter: Secondary | ICD-10-CM | POA: Diagnosis not present

## 2017-10-31 DIAGNOSIS — I10 Essential (primary) hypertension: Secondary | ICD-10-CM | POA: Insufficient documentation

## 2017-10-31 DIAGNOSIS — T63421A Toxic effect of venom of ants, accidental (unintentional), initial encounter: Secondary | ICD-10-CM | POA: Insufficient documentation

## 2017-10-31 DIAGNOSIS — T63421D Toxic effect of venom of ants, accidental (unintentional), subsequent encounter: Secondary | ICD-10-CM | POA: Diagnosis not present

## 2017-10-31 MED ORDER — EPINEPHRINE 0.3 MG/0.3ML IJ SOAJ: 0.3000 mg | Freq: Once | INTRAMUSCULAR | 1 refills | Status: AC

## 2017-10-31 NOTE — Progress Notes (Signed)
[address] [place] [address] Dept: [phone]  New Patient Note  Patient ID: Kristina Love, female    DOB: 04-23-1950  Age: 67 y.o. MRN: [id number] Date of Office Visit: 10/31/2017 Referring provider: Bosie Clos, MD [address] [place], [place] [address]    Chief Complaint: Allergic Reaction  HPI Kristina Love presents for for evaluation of an allergic reaction.  About 1 month ago after eating shrimp she developed generalized hives.  She was given prednisone and Benadryl and the hives were gone within a few days.  She has been able to eat shrimp in the past.  She does not have a history of asthma or eczema.  She has mild sinus disease.  If she has a red ant or fire ant bite she has local swelling, hives and swelling of her face and feet.  She goes to the emergency room.  The hives are gone by the second day.  Review of Systems  Constitutional: Negative.   HENT: Negative.   Respiratory: Negative.   Cardiovascular:       Hypertension  Gastrointestinal: Negative.   Genitourinary: Negative.   Musculoskeletal:       Arthritis of left knee  Skin:       Hives for 2days 1 month ago .  Swelling of the face and hives from ant bites  Neurological: Negative.   Endo/Heme/Allergies:       No diabetes or thyroid disease  Psychiatric/Behavioral: Negative.     Outpatient Encounter Medications as of 10/31/2017  Medication Sig  . diphenhydrAMINE (BENADRYL) 25 mg capsule Take 50 milligrams (two capsules) every 6 hours as needed for allergy symptoms.  . hydrochlorothiazide (HYDRODIURIL) 12.5 MG tablet Take 12.5 mg by mouth daily.  . Melatonin 5 MG TABS Take 1 tablet by mouth at bedtime as needed. For sleep   . telmisartan (MICARDIS) 20 MG tablet Take 20 mg by mouth daily.  Marland Kitchen EPINEPHrine 0.3 mg/0.3 mL IJ SOAJ injection Inject 0.3 mLs (0.3 mg total) into the muscle once for 1 dose.  . [DISCONTINUED] esomeprazole (NEXIUM) 40 MG capsule Take 40 mg by mouth as needed. For acid  reflux  . [DISCONTINUED] famotidine (PEPCID) 20 MG tablet Take 1 tablet (20 mg total) by mouth 2 (two) times daily.  . [DISCONTINUED] insulin lispro (HUMALOG) 100 UNIT/ML injection Inject into the skin 3 (three) times daily before meals.    . [DISCONTINUED] lisinopril (PRINIVIL,ZESTRIL) 40 MG tablet Take 40 mg by mouth as directed. Take 1/2 tablet daily   . [DISCONTINUED] naproxen sodium (ANAPROX) 220 MG tablet Take 440 mg by mouth 2 (two) times daily with a meal.    . [DISCONTINUED] predniSONE (DELTASONE) 20 MG tablet 3 tabs po day one, then 2 po daily x 4 days   No facility-administered encounter medications on file as of 10/31/2017.      Drug Allergies:  No Known Allergies  Family History: Mahsa's family history includes Cancer in her daughter and father; Diabetes in her father and mother; Heart attack in her mother; Hyperlipidemia in her mother; Hypertension in her father and mother..  Family history is positive for asthma and hayfever.  Family history is negative for angioedema, eczema, hives, food allergies colitis or emphysema.  Social and environmental.  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  There are no pets in the home.  She is not exposed to cigarette smoking.  She has not smoked cigarettes in the past  Physical Exam: BP Marland Kitchen)  166/98   Pulse (!) 52   Temp 98.4 F (36.9 C) (Oral)   Resp 20   Ht 5\' 2"  (1.575 m)   Wt 207 lb 3.2 oz (94 kg)   SpO2 95%   BMI 37.90 kg/m    Physical Exam  Constitutional: She is oriented to person, place, and time. She appears well-developed and well-nourished.  HENT:  Eyes normal.  Ears normal.  Nose normal.  Pharynx normal.  Neck: Neck supple. No thyromegaly present.  Cardiovascular:  S1-S2 normal no murmur  Pulmonary/Chest:  Clear to percussion and auscultation  Abdominal: Soft. There is no tenderness.  No hepatosplenomegaly  Lymphadenopathy:    She has no cervical adenopathy.  Neurological: She is alert and  oriented to person, place, and time.  Skin:  Clear  Psychiatric: She has a normal mood and affect. Her behavior is normal. Judgment and thought content normal.  Vitals reviewed.   Diagnostics: Allergy skin test did not show a significant reaction to a food   Assessment  Assessment and Plan: 1. Toxic effect of venom of ants, unintentional, subsequent encounter   2. Anaphylactoid reaction due to food, subsequent encounter   3. Essential hypertension     Meds ordered this encounter  Medications  . EPINEPHrine 0.3 mg/0.3 mL IJ SOAJ injection    Sig: Inject 0.3 mLs (0.3 mg total) into the muscle once for 1 dose.    Dispense:  2 Device    Refill:  1    Patient Instructions  If you have an  ant bite, take Benadryl 50 mg every 4 hours and if you have life-threatening symptoms inject with EpiPen 0.3 mg .  If your reaction is not related to ant bites , write  what you had to eat or drink in the previous 4 hours I will call you with the results of your blood work for allergy to ants  and shellfish. Avoid shellfish until you hear from me Monitor your blood pressure and make sure that it falls Let us know if you have any other allergic reactions     Return if symptoms worsen or fail to improve.   Thank you for the opportunity to care for this patient.  Please do not hesitate to contact me with questions.  Tonette Bihari, M.D.  [facility] [address] [place], [place] [address] [phone]

## 2017-10-31 NOTE — Patient Instructions (Addendum)
If you have an  ant bite, take Benadryl 50 mg every 4 hours and if you have life-threatening symptoms inject with EpiPen 0.3 mg .  If your reaction is not related to ant bites , write  what you had to eat or drink in the previous 4 hours I will call you with the results of your blood work for allergy to ants  and shellfish. Avoid shellfish until you hear from me Monitor your blood pressure and make sure that it falls Let us know if you have any other allergic reactions

## 2019-09-07 ENCOUNTER — Encounter (HOSPITAL_BASED_OUTPATIENT_CLINIC_OR_DEPARTMENT_OTHER): Payer: Self-pay

## 2019-09-07 ENCOUNTER — Emergency Department (HOSPITAL_BASED_OUTPATIENT_CLINIC_OR_DEPARTMENT_OTHER): Payer: Self-pay

## 2019-09-07 ENCOUNTER — Other Ambulatory Visit: Payer: Self-pay

## 2019-09-07 ENCOUNTER — Emergency Department (HOSPITAL_BASED_OUTPATIENT_CLINIC_OR_DEPARTMENT_OTHER)
Admission: EM | Admit: 2019-09-07 | Discharge: 2019-09-07 | Disposition: A | Discharge status: Home / Self Care | Payer: Self-pay | Attending: Emergency Medicine | Admitting: Emergency Medicine

## 2019-09-07 DIAGNOSIS — Y999 Unspecified external cause status: Secondary | ICD-10-CM | POA: Insufficient documentation

## 2019-09-07 DIAGNOSIS — M159 Polyosteoarthritis, unspecified: Secondary | ICD-10-CM

## 2019-09-07 DIAGNOSIS — Z79899 Other long term (current) drug therapy: Secondary | ICD-10-CM | POA: Insufficient documentation

## 2019-09-07 DIAGNOSIS — M1990 Unspecified osteoarthritis, unspecified site: Secondary | ICD-10-CM | POA: Insufficient documentation

## 2019-09-07 DIAGNOSIS — Y939 Activity, unspecified: Secondary | ICD-10-CM | POA: Insufficient documentation

## 2019-09-07 DIAGNOSIS — I10 Essential (primary) hypertension: Secondary | ICD-10-CM | POA: Insufficient documentation

## 2019-09-07 DIAGNOSIS — Y92410 Unspecified street and highway as the place of occurrence of the external cause: Secondary | ICD-10-CM | POA: Insufficient documentation

## 2019-09-07 DIAGNOSIS — M25562 Pain in left knee: Secondary | ICD-10-CM | POA: Insufficient documentation

## 2019-09-07 DIAGNOSIS — S39012A Strain of muscle, fascia and tendon of lower back, initial encounter: Secondary | ICD-10-CM | POA: Insufficient documentation

## 2019-09-07 MED ORDER — CYCLOBENZAPRINE HCL 10 MG PO TABS: 10.0000 mg | ORAL_TABLET | Freq: Two times a day (BID) | ORAL | 0 refills | Status: AC | PRN

## 2019-09-07 MED ORDER — LIDOCAINE 5 % EX PTCH: 1.0000 | MEDICATED_PATCH | CUTANEOUS | 0 refills | Status: AC

## 2019-09-07 MED FILL — CYCLOBENZAPRINE HCL 10 MG T: 10 | 10 days supply | Qty: 20 | Fill #0
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
## 2023-02-04 ENCOUNTER — Emergency Department (HOSPITAL_BASED_OUTPATIENT_CLINIC_OR_DEPARTMENT_OTHER)
Admission: EM | Admit: 2023-02-04 | Discharge: 2023-02-04 | Disposition: A | Discharge status: Home / Self Care | Payer: Medicare (Managed Care) | Attending: Emergency Medicine | Admitting: Emergency Medicine

## 2023-02-04 ENCOUNTER — Encounter (HOSPITAL_BASED_OUTPATIENT_CLINIC_OR_DEPARTMENT_OTHER): Payer: Self-pay | Admitting: Emergency Medicine

## 2023-02-04 ENCOUNTER — Other Ambulatory Visit: Payer: Self-pay

## 2023-02-04 DIAGNOSIS — R040 Epistaxis: Secondary | ICD-10-CM | POA: Insufficient documentation

## 2023-02-04 DIAGNOSIS — I10 Essential (primary) hypertension: Secondary | ICD-10-CM | POA: Insufficient documentation

## 2023-02-04 MED ORDER — OXYMETAZOLINE HCL 0.05 % NA SOLN
1.0000 | Freq: Once | NASAL | Status: DC
  Filled 2023-02-04: qty 30

## 2023-02-04 MED ORDER — OXYMETAZOLINE HCL 0.05 % NA SOLN
1.0000 | Freq: Once | NASAL | Status: AC
Start: 2023-02-04 — End: 2023-02-04
  Administered 2023-02-04: 1 via NASAL

## 2023-02-04 NOTE — ED Provider Notes (Signed)
[facility]GENC[facility] Provider Note   CSN: [id number] Arrival date & time: 02/04/23  0505     History  Chief Complaint  Patient presents with   Epistaxis    Kristina Love is a 73 y.o. female.  The history is provided by the patient.  Epistaxis Kristina Love is a 73 y.o. female who presents to the Emergency Department complaining of epistaxis.  She presents to the emergency department for evaluation of epistaxis that started at 4:30 in the morning when she woke up.  She noticed blood coming out of both nostrils.  No history of nosebleeds.  She did recently have a cold with small amount of blood in her "boogers."  No spontaneous gingival bleeding, hematochezia, melena, hematuria.  No chest pain, difficulty breathing, dizziness.   Hx/o HTN, HPL, CVA. Takes plavix     Home Medications Prior to Admission medications   Medication Sig Start Date End Date Taking? Authorizing Provider  cyclobenzaprine (FLEXERIL) 10 MG tablet Take 1 tablet (10 mg total) by mouth 2 (two) times daily as needed for muscle spasms. 09/07/19   Alvira Monday, MD  diphenhydrAMINE (BENADRYL) 25 mg capsule Take 50 milligrams (two capsules) every 6 hours as needed for allergy symptoms. 07/18/16   Molpus, John, MD  hydrochlorothiazide (HYDRODIURIL) 12.5 MG tablet Take 12.5 mg by mouth daily.    [provider]  lidocaine (LIDODERM) 5 % Place 1 patch onto the skin daily. Remove & Discard patch within 12 hours or as directed by MD 09/07/19   Alvira Monday, MD  Melatonin 5 MG TABS Take 1 tablet by mouth at bedtime as needed. For sleep     [provider]  telmisartan (MICARDIS) 20 MG tablet Take 20 mg by mouth daily.    [provider]      Allergies    Patient has no known allergies.    Review of Systems   Review of Systems  HENT:  Positive for nosebleeds.   All other systems reviewed and are negative.   Physical Exam Updated Vital Signs BP 120/74  (BP Location: Right Arm)   Pulse (!) 54   Temp 98.2 F (36.8 C) (Oral)   Resp 18   Ht 5\' 2"  (1.575 m)   Wt 73.5 kg   SpO2 100%   BMI 29.63 kg/m  Physical Exam Vitals and nursing note reviewed.  Constitutional:      Appearance: She is well-developed.  HENT:     Head: Normocephalic and atraumatic.     Comments: Mild erythema and edema in right nare without blood.  Left nare WNL.  No blood in the posterior OP Cardiovascular:     Rate and Rhythm: Normal rate and regular rhythm.  Pulmonary:     Effort: Pulmonary effort is normal. No respiratory distress.  Musculoskeletal:        General: No tenderness.  Skin:    General: Skin is warm and dry.  Neurological:     Mental Status: She is alert and oriented to person, place, and time.  Psychiatric:        Behavior: Behavior normal.     ED Results / Procedures / Treatments   Labs (all labs ordered are listed, but only abnormal results are displayed) Labs Reviewed - No data to display  EKG None  Radiology No results found.  Procedures Procedures    Medications Ordered in ED Medications  oxymetazoline (AFRIN) 0.05 % nasal spray 1 spray (1 spray Each Nare Not  Given 02/04/23 0616)  oxymetazoline (AFRIN) 0.05 % nasal spray 1 spray (1 spray Each Nare Given 02/04/23 2956)    ED Course/ Medical Decision Making/ A&P                                 Medical Decision Making Risk OTC drugs.  Patient here for evaluation of episode of epistaxis, resolved at time of ED arrival.  She does have stigmata of bleeding on her shirt.  She does have some mild erythema and edema in the right nare, no definite lesion is visualized.  There is no blood in the posterior oropharynx or evidence of posterior bleed at this time.  On record review she had labs at her PCP obtained on December 11 with normal hemoglobin and platelet count.  She was treated with Afrin in the emergency department after nasal clip was placed.  She was observed without  recurrent bleeding, able to ambulate without recurrent bleeding.  Plan to discharge home with outpatient follow-up regarding her epistaxis.  Discussed return precautions.  Current clinical picture is not consistent with coagulopathy.         Final Clinical Impression(s) / ED Diagnoses Final diagnoses:  Epistaxis    Rx / DC Orders ED Discharge Orders     None         Tilden Fossa, MD 02/04/23 0710

## 2023-02-04 NOTE — ED Triage Notes (Signed)
Pt woke up about 20 mins ago, nose bleed. Is bleeding in triage. Pt takes Plavix took yesterday.

## 2023-02-04 NOTE — ED Notes (Signed)
Pt ambulated independently with normal gait to restroom & back. No epistaxis observed. Pt states she is ready to go so she can go to work in a little bit. EDP made aware.

## 2023-02-04 NOTE — ED Notes (Signed)
Pt removed epistaxis clip. No active bleeding observed at this time. Pt states she is still getting up some clots when she coughs.

## 2023-02-04 NOTE — ED Notes (Signed)
ED charge RN Gaspar Garbe placed epistaxis clip onto pt's nose.

## 2023-06-07 DIAGNOSIS — M25551 Pain in right hip: Secondary | ICD-10-CM | POA: Diagnosis not present

## 2023-06-07 DIAGNOSIS — M25552 Pain in left hip: Secondary | ICD-10-CM | POA: Diagnosis not present

## 2023-06-07 DIAGNOSIS — M1611 Unilateral primary osteoarthritis, right hip: Secondary | ICD-10-CM | POA: Diagnosis not present

## 2023-06-13 DIAGNOSIS — Z1159 Encounter for screening for other viral diseases: Secondary | ICD-10-CM | POA: Diagnosis not present

## 2023-06-13 DIAGNOSIS — Z1339 Encounter for screening examination for other mental health and behavioral disorders: Secondary | ICD-10-CM | POA: Diagnosis not present

## 2023-06-13 DIAGNOSIS — Z0000 Encounter for general adult medical examination without abnormal findings: Secondary | ICD-10-CM | POA: Diagnosis not present

## 2023-06-13 DIAGNOSIS — Z1231 Encounter for screening mammogram for malignant neoplasm of breast: Secondary | ICD-10-CM | POA: Diagnosis not present

## 2023-06-13 DIAGNOSIS — I10 Essential (primary) hypertension: Secondary | ICD-10-CM | POA: Diagnosis not present

## 2023-06-13 DIAGNOSIS — M170 Bilateral primary osteoarthritis of knee: Secondary | ICD-10-CM | POA: Diagnosis not present

## 2023-06-13 DIAGNOSIS — Z9989 Dependence on other enabling machines and devices: Secondary | ICD-10-CM | POA: Diagnosis not present

## 2023-06-13 DIAGNOSIS — I6789 Other cerebrovascular disease: Secondary | ICD-10-CM | POA: Diagnosis not present

## 2023-06-13 DIAGNOSIS — E782 Mixed hyperlipidemia: Secondary | ICD-10-CM | POA: Diagnosis not present

## 2023-06-13 DIAGNOSIS — M19041 Primary osteoarthritis, right hand: Secondary | ICD-10-CM | POA: Diagnosis not present

## 2023-06-13 DIAGNOSIS — Z1211 Encounter for screening for malignant neoplasm of colon: Secondary | ICD-10-CM | POA: Diagnosis not present

## 2023-06-13 DIAGNOSIS — Z13820 Encounter for screening for osteoporosis: Secondary | ICD-10-CM | POA: Diagnosis not present

## 2023-06-13 DIAGNOSIS — R011 Cardiac murmur, unspecified: Secondary | ICD-10-CM | POA: Diagnosis not present

## 2023-06-13 DIAGNOSIS — Z7689 Persons encountering health services in other specified circumstances: Secondary | ICD-10-CM | POA: Diagnosis not present

## 2023-06-13 DIAGNOSIS — Z1389 Encounter for screening for other disorder: Secondary | ICD-10-CM | POA: Diagnosis not present

## 2023-06-13 DIAGNOSIS — Z1331 Encounter for screening for depression: Secondary | ICD-10-CM | POA: Diagnosis not present

## 2023-06-13 DIAGNOSIS — Z1383 Encounter for screening for respiratory disorder NEC: Secondary | ICD-10-CM | POA: Diagnosis not present

## 2023-06-13 DIAGNOSIS — Z23 Encounter for immunization: Secondary | ICD-10-CM | POA: Diagnosis not present

## 2023-06-13 DIAGNOSIS — E559 Vitamin D deficiency, unspecified: Secondary | ICD-10-CM | POA: Diagnosis not present

## 2023-06-13 DIAGNOSIS — Z87891 Personal history of nicotine dependence: Secondary | ICD-10-CM | POA: Diagnosis not present

## 2023-06-13 DIAGNOSIS — K219 Gastro-esophageal reflux disease without esophagitis: Secondary | ICD-10-CM | POA: Diagnosis not present
